# Patient Record
Sex: Male | Born: 1990 | Race: White | Hispanic: No | Smoking: Current some day smoker
Health system: Southern US, Community
[De-identification: ages and names within clinical notes are randomized; demographics above are authoritative.]

---
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
## 2010-08-30 ENCOUNTER — Inpatient Hospital Stay (INDEPENDENT_AMBULATORY_CARE_PROVIDER_SITE_OTHER)
Admission: RE | Admit: 2010-08-30 | Discharge: 2010-08-30 | Disposition: A | Discharge status: Home / Self Care | Payer: Self-pay | Source: Ambulatory Visit | Attending: Family Medicine | Admitting: Family Medicine

## 2010-08-30 DIAGNOSIS — N390 Urinary tract infection, site not specified: Secondary | ICD-10-CM

## 2010-08-30 LAB — POCT URINALYSIS DIP (DEVICE)
Ketones, ur: 15 mg/dL — AB
Protein, ur: NEGATIVE mg/dL
Specific Gravity, Urine: 1.02 (ref 1.005–1.030)
pH: 7 (ref 5.0–8.0)

## 2011-02-07 ENCOUNTER — Emergency Department: Payer: Self-pay | Admitting: *Deleted

## 2011-11-27 ENCOUNTER — Emergency Department: Payer: Self-pay | Admitting: Emergency Medicine

## 2011-11-27 LAB — URINALYSIS, COMPLETE
Bacteria: NONE SEEN
Bilirubin,UR: NEGATIVE
Glucose,UR: NEGATIVE mg/dL (ref 0–75)
Ph: 7 (ref 4.5–8.0)
RBC,UR: 2 /HPF (ref 0–5)
WBC UR: 12 /HPF (ref 0–5)

## 2011-11-29 LAB — URINE CULTURE

## 2013-05-05 ENCOUNTER — Emergency Department: Payer: Self-pay | Admitting: Emergency Medicine

## 2014-03-07 ENCOUNTER — Emergency Department: Payer: Self-pay | Admitting: Emergency Medicine

## 2014-03-07 LAB — URINALYSIS, COMPLETE
BILIRUBIN, UR: NEGATIVE
Bacteria: NONE SEEN
Blood: NEGATIVE
GLUCOSE, UR: NEGATIVE mg/dL (ref 0–75)
Ketone: NEGATIVE
LEUKOCYTE ESTERASE: NEGATIVE
Nitrite: NEGATIVE
PROTEIN: NEGATIVE
Ph: 7 (ref 4.5–8.0)
Specific Gravity: 1.017 (ref 1.003–1.030)
WBC UR: <1 /HPF (ref 0–5)

## 2014-03-07 LAB — GC/CHLAMYDIA PROBE AMP

## 2017-06-30 ENCOUNTER — Emergency Department: Payer: Self-pay

## 2017-06-30 ENCOUNTER — Other Ambulatory Visit: Payer: Self-pay

## 2017-06-30 ENCOUNTER — Encounter: Payer: Self-pay | Admitting: Emergency Medicine

## 2017-06-30 ENCOUNTER — Emergency Department
Admission: EM | Admit: 2017-06-30 | Discharge: 2017-06-30 | Disposition: A | Discharge status: Home / Self Care | Payer: Self-pay | Attending: Emergency Medicine | Admitting: Emergency Medicine

## 2017-06-30 DIAGNOSIS — Z23 Encounter for immunization: Secondary | ICD-10-CM | POA: Insufficient documentation

## 2017-06-30 DIAGNOSIS — R059 Cough, unspecified: Secondary | ICD-10-CM

## 2017-06-30 DIAGNOSIS — H5712 Ocular pain, left eye: Secondary | ICD-10-CM

## 2017-06-30 DIAGNOSIS — Y929 Unspecified place or not applicable: Secondary | ICD-10-CM | POA: Insufficient documentation

## 2017-06-30 DIAGNOSIS — F17200 Nicotine dependence, unspecified, uncomplicated: Secondary | ICD-10-CM | POA: Insufficient documentation

## 2017-06-30 DIAGNOSIS — Y999 Unspecified external cause status: Secondary | ICD-10-CM | POA: Insufficient documentation

## 2017-06-30 DIAGNOSIS — S61210A Laceration without foreign body of right index finger without damage to nail, initial encounter: Secondary | ICD-10-CM | POA: Insufficient documentation

## 2017-06-30 DIAGNOSIS — S60221A Contusion of right hand, initial encounter: Secondary | ICD-10-CM | POA: Insufficient documentation

## 2017-06-30 DIAGNOSIS — R05 Cough: Secondary | ICD-10-CM | POA: Insufficient documentation

## 2017-06-30 DIAGNOSIS — Y9389 Activity, other specified: Secondary | ICD-10-CM | POA: Insufficient documentation

## 2017-06-30 DIAGNOSIS — T07XXXA Unspecified multiple injuries, initial encounter: Secondary | ICD-10-CM

## 2017-06-30 DIAGNOSIS — S0083XA Contusion of other part of head, initial encounter: Secondary | ICD-10-CM | POA: Insufficient documentation

## 2017-06-30 DIAGNOSIS — W503XXA Accidental bite by another person, initial encounter: Secondary | ICD-10-CM

## 2017-06-30 MED ORDER — ACETAMINOPHEN 500 MG PO TABS
1000.0000 mg | ORAL_TABLET | Freq: Once | ORAL | Status: AC
  Administered 2017-06-30: 1000 mg via ORAL
  Filled 2017-06-30: qty 2

## 2017-06-30 MED ORDER — FLUORESCEIN SODIUM 1 MG OP STRP
ORAL_STRIP | OPHTHALMIC | Status: AC
  Administered 2017-06-30: 1
  Filled 2017-06-30: qty 1

## 2017-06-30 MED ORDER — AMOXICILLIN-POT CLAVULANATE 875-125 MG PO TABS: 1.0000 | ORAL_TABLET | Freq: Two times a day (BID) | ORAL | 0 refills | Status: AC

## 2017-06-30 MED ORDER — TETRACAINE HCL 0.5 % OP SOLN
1.0000 [drp] | Freq: Once | OPHTHALMIC | Status: AC
Start: 2017-06-30 — End: 2017-06-30
  Administered 2017-06-30: 1 [drp] via OPHTHALMIC
  Filled 2017-06-30: qty 4

## 2017-06-30 MED ORDER — AMOXICILLIN-POT CLAVULANATE 875-125 MG PO TABS
1.0000 | ORAL_TABLET | Freq: Once | ORAL | Status: AC
  Administered 2017-06-30: 1 via ORAL
  Filled 2017-06-30: qty 1

## 2017-06-30 MED ORDER — TETANUS-DIPHTH-ACELL PERTUSSIS 5-2.5-18.5 LF-MCG/0.5 IM SUSP
0.5000 mL | Freq: Once | INTRAMUSCULAR | Status: AC
  Administered 2017-06-30: 0.5 mL via INTRAMUSCULAR
  Filled 2017-06-30: qty 0.5
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
## 2017-08-05 ENCOUNTER — Encounter: Payer: Self-pay | Admitting: Emergency Medicine

## 2017-08-05 ENCOUNTER — Emergency Department
Admission: EM | Admit: 2017-08-05 | Discharge: 2017-08-05 | Disposition: A | Discharge status: Home / Self Care | Attending: Emergency Medicine | Admitting: Emergency Medicine

## 2017-08-05 ENCOUNTER — Other Ambulatory Visit: Payer: Self-pay

## 2017-08-05 DIAGNOSIS — F17210 Nicotine dependence, cigarettes, uncomplicated: Secondary | ICD-10-CM | POA: Insufficient documentation

## 2017-08-05 DIAGNOSIS — L84 Corns and callosities: Secondary | ICD-10-CM | POA: Insufficient documentation

## 2017-08-05 DIAGNOSIS — M79672 Pain in left foot: Secondary | ICD-10-CM

## 2017-08-05 MED ORDER — DICLOFENAC SODIUM 1 % TD GEL: 2.0000 g | Freq: Four times a day (QID) | TRANSDERMAL | 0 refills | Status: AC
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
## 2017-08-05 NOTE — ED Notes (Signed)
esig does not work and unable to print out.  Patient given instruictions.

## 2017-08-05 NOTE — ED Triage Notes (Signed)
Patient presents to ED via POV from home with c/o left heel pain. Slight redness noted. Ambulatory to triage.

## 2017-08-24 ENCOUNTER — Encounter (HOSPITAL_COMMUNITY): Payer: Self-pay | Admitting: Emergency Medicine

## 2017-08-24 ENCOUNTER — Emergency Department (HOSPITAL_COMMUNITY)
Admission: EM | Admit: 2017-08-24 | Discharge: 2017-08-24 | Disposition: A | Discharge status: Home / Self Care | Payer: Self-pay | Attending: Emergency Medicine | Admitting: Emergency Medicine

## 2017-08-24 DIAGNOSIS — F17210 Nicotine dependence, cigarettes, uncomplicated: Secondary | ICD-10-CM | POA: Insufficient documentation

## 2017-08-24 DIAGNOSIS — Z0000 Encounter for general adult medical examination without abnormal findings: Secondary | ICD-10-CM | POA: Insufficient documentation

## 2017-08-24 DIAGNOSIS — Z139 Encounter for screening, unspecified: Secondary | ICD-10-CM
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
## 2017-09-05 ENCOUNTER — Encounter: Payer: Self-pay | Admitting: Emergency Medicine

## 2017-09-05 ENCOUNTER — Other Ambulatory Visit: Payer: Self-pay

## 2017-09-05 ENCOUNTER — Emergency Department
Admission: EM | Admit: 2017-09-05 | Discharge: 2017-09-05 | Disposition: A | Discharge status: Home / Self Care | Payer: Self-pay | Attending: Emergency Medicine | Admitting: Emergency Medicine

## 2017-09-05 DIAGNOSIS — R300 Dysuria: Secondary | ICD-10-CM | POA: Insufficient documentation

## 2017-09-05 DIAGNOSIS — N4889 Other specified disorders of penis: Secondary | ICD-10-CM | POA: Insufficient documentation

## 2017-09-05 DIAGNOSIS — F17210 Nicotine dependence, cigarettes, uncomplicated: Secondary | ICD-10-CM | POA: Insufficient documentation

## 2017-09-05 DIAGNOSIS — R369 Urethral discharge, unspecified: Secondary | ICD-10-CM

## 2017-09-05 DIAGNOSIS — Z113 Encounter for screening for infections with a predominantly sexual mode of transmission: Secondary | ICD-10-CM

## 2017-09-05 LAB — URINALYSIS, COMPLETE (UACMP) WITH MICROSCOPIC
BACTERIA UA: NONE SEEN
BILIRUBIN URINE: NEGATIVE
Glucose, UA: NEGATIVE mg/dL
KETONES UR: NEGATIVE mg/dL
Nitrite: NEGATIVE
PH: 6 (ref 5.0–8.0)
Protein, ur: 30 mg/dL — AB
Specific Gravity, Urine: 1.024 (ref 1.005–1.030)
Squamous Epithelial / LPF: NONE SEEN (ref 0–5)

## 2017-09-05 LAB — CHLAMYDIA/NGC RT PCR (ARMC ONLY)
Chlamydia Tr: DETECTED — AB
N gonorrhoeae: DETECTED — AB

## 2017-09-05 MED ORDER — CEFTRIAXONE SODIUM 250 MG IJ SOLR
250.0000 mg | Freq: Once | INTRAMUSCULAR | Status: AC
  Administered 2017-09-05: 250 mg via INTRAMUSCULAR
  Filled 2017-09-05: qty 250

## 2017-09-05 MED ORDER — AZITHROMYCIN 500 MG PO TABS
1000.0000 mg | ORAL_TABLET | Freq: Once | ORAL | Status: AC
  Administered 2017-09-05: 1000 mg via ORAL
  Filled 2017-09-05: qty 2

## 2017-09-05 NOTE — ED Provider Notes (Signed)
[facility] Emergency Department Provider Note  ____________________________________________  Time seen: Approximately 5:26 PM  I have reviewed the triage vital signs and the nursing notes.   HISTORY  Chief Complaint Exposure to STD    HPI Ernest Li is a 27 y.o. male who presents emergency department complaining of dysuria, penile irritation, penile drainage.  Patient reports that he had unprotected sex 3 days ago with a new partner.  Patient is unsure of partners STD status.  Patient reports that since then he has developed dysuria, constant urethral irritation, penile drainage.  Patient denies any hematuria.  He reports that there is some tenderness in the suprapubic region with urination, however no tenderness at baseline.  No other complaints.  Patient denies any flank pain.  No history of kidney stones.  Patient reports that he is bisexual and he does have concerns for other STDs.  At this time, lengthy discussion was had with patient and he will follow-up with health department for complete STD panel.  Patient is currently asymptomatic for other STDs to include jaundice, right upper quadrant pain, unexplained rash, weight loss, lesions or chancres.  No other complaints at this time  History reviewed. No pertinent past medical history.  There are no active problems to display for this patient.   History reviewed. No pertinent surgical history.  Prior to Admission medications   Medication Sig Start Date End Date Taking? Authorizing Provider  diclofenac sodium (VOLTAREN) 1 % GEL Apply 2 g topically 4 (four) times daily. 08/05/17   Enid Derry, PA-C    Allergies Patient has no known allergies.  History reviewed. No pertinent family history.  Social History Social History   Tobacco Use  . Smoking status: Current Some Day Smoker    Packs/day: 0.50    Types: Cigarettes  . Smokeless tobacco: Never Used  Substance Use Topics  . Alcohol use: Yes     Comment: Social   . Drug use: Yes    Types: Marijuana     Review of Systems  Constitutional: No fever/chills Eyes: No visual changes. No discharge ENT: No upper respiratory complaints. Cardiovascular: no chest pain. Respiratory: no cough. No SOB. Gastrointestinal: No abdominal pain.  No nausea, no vomiting.  No diarrhea.  No constipation. Genitourinary: Positive for dysuria, penile discharge, urethral irritation. No hematuria Musculoskeletal: Negative for musculoskeletal pain. Skin: Negative for rash, abrasions, lacerations, ecchymosis. Neurological: Negative for headaches, focal weakness or numbness. 10-point ROS otherwise negative.  ____________________________________________   PHYSICAL EXAM:  VITAL SIGNS: ED Triage Vitals [09/05/17 1634]  Enc Vitals Group     BP (!) 157/94     Pulse Rate (!) 107     Resp 18     Temp 97.9 F (36.6 C)     Temp Source Oral     SpO2 95 %     Weight 150 lb (68 kg)     Height  (1.676 m)     Head Circumference      Peak Flow      Pain Score 0     Pain Loc      Pain Edu?      Excl. in GC?      Constitutional: Alert and oriented. Well appearing and in no acute distress. Eyes: Conjunctivae are normal. PERRL. EOMI. Head: Atraumatic. Neck: No stridor.   Hematological/Lymphatic/Immunilogical: No inguinal lymphadenopathy Cardiovascular: Normal rate, regular rhythm. Normal S1 and S2.  Good peripheral circulation. Respiratory: Normal respiratory effort without tachypnea or retractions. Lungs CTAB. Good  air entry to the bases with no decreased or absent breath sounds. Gastrointestinal: Bowel sounds 4 quadrants. Soft and nontender to palpation. No guarding or rigidity. No palpable masses. No distention. No CVA tenderness. Genitourinary: No visible lesions or chancres.  No discharge.  No tenderness to palpation of genitalia or testicles. Musculoskeletal: Full range of motion to all extremities. No gross deformities  appreciated. Neurologic:  Normal speech and language. No gross focal neurologic deficits are appreciated.  Skin:  Skin is warm, dry and intact. No rash noted. Psychiatric: Mood and affect are normal. Speech and behavior are normal. Patient exhibits appropriate insight and judgement.   ____________________________________________   LABS (all labs ordered are listed, but only abnormal results are displayed)  Labs Reviewed  CHLAMYDIA/NGC RT PCR ([facility] ONLY)  URINALYSIS, COMPLETE (UACMP) WITH MICROSCOPIC   ____________________________________________  EKG   ____________________________________________  RADIOLOGY   No results found.  ____________________________________________    PROCEDURES  Procedure(s) performed:    Procedures    Medications  cefTRIAXone (ROCEPHIN) injection 250 mg (has no administration in time range)  azithromycin (ZITHROMAX) tablet 1,000 mg (has no administration in time range)     ____________________________________________   INITIAL IMPRESSION / ASSESSMENT AND PLAN / ED COURSE  Pertinent labs & imaging results that were available during my care of the patient were reviewed by me and considered in my medical decision making (see chart for details).  Review of the [place] CSRS was performed in accordance of the [facility] prior to dispensing any controlled drugs.     Patient's diagnosis is consistent with dysuria, penile discharge, encounter for STD testing.  Patient presents the emergency department with dysuria, penile discharge x2 days.  Patient had a sexual encounter with a new partner that was unprotected 3 days prior.  Symptoms are most consistent with gonorrhea and/or chlamydia.  Patient is tested for same.  Patient was given option to wait for testing to return to be treated empirically.  Patient opts for empiric treatment.  Patient was treated with Rocephin and Zithromax.  He is advised to follow-up with health department for  complete STD panel.  Patient verbalizes understanding of same.  No indication for further work-up at this time.  No prescriptions at discharge.. Patient is given ED precautions to return to the ED for any worsening or new symptoms.     ____________________________________________  FINAL CLINICAL IMPRESSION(S) / ED DIAGNOSES  Final diagnoses:  Screen for STD (sexually transmitted disease)  Penile discharge  Dysuria      NEW MEDICATIONS STARTED DURING THIS VISIT:  ED Discharge Orders    None          This chart was dictated using voice recognition software/Dragon. Despite best efforts to proofread, errors can occur which can change the meaning. Any change was purely unintentional.    Racheal Patches, PA-C 09/05/17 1732    Jeanmarie Plant, MD 09/05/17 2252

## 2017-09-05 NOTE — ED Triage Notes (Signed)
Pt reports 2-3 days ago had unprotected sex with male, states he has noticed difficulty urinating and penile discharge, pt states he is having discomfort in the subprapubic area.

## 2017-09-07 ENCOUNTER — Telehealth: Payer: Self-pay | Admitting: Emergency Medicine

## 2017-09-07 NOTE — Telephone Encounter (Signed)
Called patient to give std results.  Mom will have him call me when he is in.

## 2018-06-15 ENCOUNTER — Encounter (HOSPITAL_COMMUNITY): Payer: Self-pay | Admitting: Emergency Medicine

## 2018-06-15 ENCOUNTER — Emergency Department (HOSPITAL_COMMUNITY)
Admission: EM | Admit: 2018-06-15 | Discharge: 2018-06-15 | Disposition: A | Discharge status: Home / Self Care | Payer: Self-pay | Attending: Emergency Medicine | Admitting: Emergency Medicine

## 2018-06-15 DIAGNOSIS — F17210 Nicotine dependence, cigarettes, uncomplicated: Secondary | ICD-10-CM | POA: Insufficient documentation

## 2018-06-15 DIAGNOSIS — R369 Urethral discharge, unspecified: Secondary | ICD-10-CM | POA: Insufficient documentation

## 2018-06-15 DIAGNOSIS — R300 Dysuria: Secondary | ICD-10-CM | POA: Insufficient documentation

## 2018-06-15 MED ORDER — STERILE WATER FOR INJECTION IJ SOLN
INTRAMUSCULAR | Status: AC
  Filled 2018-06-15: qty 10

## 2018-06-15 MED ORDER — ONDANSETRON 4 MG PO TBDP
4.0000 mg | ORAL_TABLET | Freq: Once | ORAL | Status: AC
  Administered 2018-06-15: 4 mg via ORAL
  Filled 2018-06-15: qty 1

## 2018-06-15 MED ORDER — AZITHROMYCIN 250 MG PO TABS
1000.0000 mg | ORAL_TABLET | Freq: Once | ORAL | Status: AC
  Administered 2018-06-15: 1000 mg via ORAL
  Filled 2018-06-15: qty 4

## 2018-06-15 MED ORDER — CEFTRIAXONE SODIUM 250 MG IJ SOLR
250.0000 mg | Freq: Once | INTRAMUSCULAR | Status: AC
  Administered 2018-06-15: 250 mg via INTRAMUSCULAR
  Filled 2018-06-15: qty 250

## 2018-06-15 NOTE — ED Triage Notes (Signed)
Patient here from home with complaints of of painful urination and discharge "for a couple of days now". Denies n/v. Hx of same.

## 2018-06-15 NOTE — Discharge Instructions (Addendum)
We have tested you today for gonorrhea and chlamydia. We will call you if the results are positive. We have treated your today due to your history and exam for gonorrhea and chlamydia.

## 2018-06-15 NOTE — ED Provider Notes (Signed)
[facility]-EMERGENCY DEPT Provider Note   CSN: [id number] Arrival date & time: 06/15/18  1414     History   Chief Complaint Chief Complaint  Patient presents with  . Penile Discharge  . Dysuria    HPI Ernest Li is a 28 y.o. male who presents to the ED with c/o dysuria and penile discharge. Patient reports having unprotected sex. He states he has had chlamydia in the past but not with this partner.   HPI  History reviewed. No pertinent past medical history.  There are no active problems to display for this patient.   History reviewed. No pertinent surgical history.      Home Medications    Prior to Admission medications   Medication Sig Start Date End Date Taking? Authorizing Provider  diclofenac sodium (VOLTAREN) 1 % GEL Apply 2 g topically 4 (four) times daily. 08/05/17   Enid Derry, PA-C    Family History No family history on file.  Social History Social History   Tobacco Use  . Smoking status: Current Some Day Smoker    Packs/day: 0.50    Types: Cigarettes  . Smokeless tobacco: Never Used  Substance Use Topics  . Alcohol use: Yes    Comment: Social   . Drug use: Yes    Types: Marijuana     Allergies   Patient has no known allergies.   Review of Systems Review of Systems  Genitourinary: Positive for discharge and dysuria.  Hematological: Positive for adenopathy.  All other systems reviewed and are negative.    Physical Exam Updated Vital Signs BP (!) 144/93   Pulse 96   Temp 98.5 F (36.9 C)   Resp 15   SpO2 99%   Physical Exam Vitals signs and nursing note reviewed. Exam conducted with a chaperone present.  Constitutional:      General: He is not in acute distress.    Appearance: He is well-developed.  HENT:     Head: Normocephalic.     Nose: Nose normal.  Eyes:     Conjunctiva/sclera: Conjunctivae normal.  Neck:     Musculoskeletal: Neck supple.  Cardiovascular:     Rate and Rhythm: Normal  rate.  Pulmonary:     Effort: Pulmonary effort is normal.  Abdominal:     Tenderness: There is no abdominal tenderness.  Genitourinary:    Penis: Circumcised. Discharge present.      Scrotum/Testes: Normal.     Comments: Purulent ureteral discharge.  Musculoskeletal: Normal range of motion.  Lymphadenopathy:     Lower Body: Right inguinal adenopathy present.  Skin:    General: Skin is warm and dry.  Neurological:     Mental Status: He is alert and oriented to person, place, and time.  Psychiatric:        Mood and Affect: Mood normal.      ED Treatments / Results  Labs (all labs ordered are listed, but only abnormal results are displayed) Labs Reviewed  GC/CHLAMYDIA PROBE AMP ([place]) NOT AT [facility]   Radiology No results found.  Procedures Procedures (including critical care time)  Medications Ordered in ED Medications  cefTRIAXone (ROCEPHIN) injection 250 mg (has no administration in time range)  azithromycin (ZITHROMAX) tablet 1,000 mg (has no administration in time range)  ondansetron (ZOFRAN-ODT) disintegrating tablet 4 mg (has no administration in time range)     Initial Impression / Assessment and Plan / ED Course  I have reviewed the triage vital signs and the nursing notes. Pt  presents with concerns for possible STD.  Pt understands that they have GC/Chlamydia cultures pending and that they will need to inform all sexual partners if results return positive. Pt has been treated prophylactically with azithromycin and Rocephin due to pts history and physical exam. Patient d/c home with instructions to f/u with [facility] for further testing. Patient declined testing for RPR and HIV today.   Final Clinical Impressions(s) / ED Diagnoses   Final diagnoses:  Dysuria  Penile discharge    ED Discharge Orders    None       Kerrie Buffalo [place], [place] 06/15/18 1457    Mancel Bale, MD 06/15/18 [phone]

## 2018-06-15 NOTE — ED Notes (Signed)
RN attempted to discharge patient. Patient crying after rocephin shot and RN encouraged him to take a minute to breathe. Pt will be discharged when no longer crying

## 2018-06-16 LAB — GC/CHLAMYDIA PROBE AMP (~~LOC~~) NOT AT ARMC
CHLAMYDIA, DNA PROBE: NEGATIVE
NEISSERIA GONORRHEA: POSITIVE — AB

## 2020-01-27 ENCOUNTER — Emergency Department (HOSPITAL_COMMUNITY)
Admission: EM | Admit: 2020-01-27 | Discharge: 2020-01-28 | Disposition: A | Discharge status: Home / Self Care | Payer: Self-pay | Attending: Emergency Medicine | Admitting: Emergency Medicine

## 2020-01-27 ENCOUNTER — Encounter (HOSPITAL_COMMUNITY): Payer: Self-pay

## 2020-01-27 DIAGNOSIS — F17210 Nicotine dependence, cigarettes, uncomplicated: Secondary | ICD-10-CM | POA: Insufficient documentation

## 2020-01-27 DIAGNOSIS — R109 Unspecified abdominal pain: Secondary | ICD-10-CM | POA: Insufficient documentation

## 2020-01-27 LAB — URINALYSIS, ROUTINE W REFLEX MICROSCOPIC
Bilirubin Urine: NEGATIVE
Glucose, UA: NEGATIVE mg/dL
Hgb urine dipstick: NEGATIVE
Ketones, ur: NEGATIVE mg/dL
Leukocytes,Ua: NEGATIVE
Nitrite: NEGATIVE
Protein, ur: NEGATIVE mg/dL
Specific Gravity, Urine: 1.018 (ref 1.005–1.030)
pH: 6 (ref 5.0–8.0)

## 2020-01-27 NOTE — ED Triage Notes (Signed)
Onset yesterday left side back pain and urinary hesitancy.  Denies dysuria, hematuria, penile discharge, or fever.

## 2020-01-28 ENCOUNTER — Emergency Department (HOSPITAL_COMMUNITY): Payer: Self-pay

## 2020-01-28 LAB — COMPREHENSIVE METABOLIC PANEL
ALT: 34 U/L (ref 0–44)
AST: 22 U/L (ref 15–41)
Albumin: 3.7 g/dL (ref 3.5–5.0)
Alkaline Phosphatase: 68 U/L (ref 38–126)
Anion gap: 8 (ref 5–15)
BUN: 10 mg/dL (ref 6–20)
CO2: 27 mmol/L (ref 22–32)
Calcium: 9 mg/dL (ref 8.9–10.3)
Chloride: 102 mmol/L (ref 98–111)
Creatinine, Ser: 1.02 mg/dL (ref 0.61–1.24)
GFR calc Af Amer: >60 mL/min (ref >60)
GFR calc non Af Amer: >60 mL/min (ref >60)
Glucose, Bld: 92 mg/dL (ref 70–99)
Potassium: 4.1 mmol/L (ref 3.5–5.1)
Sodium: 137 mmol/L (ref 135–145)
Total Bilirubin: 0.9 mg/dL (ref 0.3–1.2)
Total Protein: 7 g/dL (ref 6.5–8.1)

## 2020-01-28 LAB — CBC
HCT: 40.2 % (ref 39.0–52.0)
Hemoglobin: 12.8 g/dL — ABNORMAL LOW (ref 13.0–17.0)
MCH: 31.9 pg (ref 26.0–34.0)
MCHC: 31.8 g/dL (ref 30.0–36.0)
MCV: 100.2 fL — ABNORMAL HIGH (ref 80.0–100.0)
Platelets: 107 10*3/uL — ABNORMAL LOW (ref 150–400)
RBC: 4.01 MIL/uL — ABNORMAL LOW (ref 4.22–5.81)
RDW: 12.2 % (ref 11.5–15.5)
WBC: 5.6 10*3/uL (ref 4.0–10.5)
nRBC: 0 % (ref 0.0–0.2)

## 2020-01-28 MED ORDER — KETOROLAC TROMETHAMINE 15 MG/ML IJ SOLN
30.0000 mg | Freq: Once | INTRAMUSCULAR | Status: AC
  Administered 2020-01-28: 30 mg via INTRAMUSCULAR
  Filled 2020-01-28: qty 2

## 2020-01-28 NOTE — Discharge Instructions (Addendum)
You were evaluated in the Emergency Department and after careful evaluation, we did not find any emergent condition requiring admission or further testing in the hospital.  Your exam/testing today was overall reassuring.  Your symptoms may be due to muscle strain or spasm.  You can use Tylenol 1000 mg every 4-6 hours and/or ibuprofen 600 mg every 4-6 hours.  It is unclear what is causing your issues with urination.  Please follow-up with urology for further management.  Please return to the Emergency Department if you experience any worsening of your condition.  Thank you for allowing [place] to be a part of your care.

## 2020-01-28 NOTE — ED Provider Notes (Signed)
[facility]-EMERGENCY DEPT [facility] Emergency Department Provider Note MRN:  [id number]  Arrival date & time: 01/28/20     Chief Complaint   Flank Pain   History of Present Illness   Ernest Li is a 29 y.o. year-old male with a history of kidney stones presenting to the ED with chief complaint of flank pain.  Location: Left flank Duration: 2 days Onset: Sudden Timing: Constant Description: Sharp Severity: Mild to moderate Exacerbating/Alleviating Factors: None Associated Symptoms: Difficulty urinating Pertinent Negatives: Denies fever, no chest pain, no shortness of breath, no burning with urination, no blood in the urine   Review of Systems  A complete 10 system review of systems was obtained and all systems are negative except as noted in the HPI and PMH.   Patient's Health History   History reviewed. No pertinent past medical history.  History reviewed. No pertinent surgical history.  History reviewed. No pertinent family history.  Social History   Socioeconomic History  . Marital status: Legally Separated    Spouse name: Not on file  . Number of children: Not on file  . Years of education: Not on file  . Highest education level: Not on file  Occupational History  . Not on file  Tobacco Use  . Smoking status: Current Some Day Smoker    Packs/day: 0.50    Types: Cigarettes  . Smokeless tobacco: Never Used  Substance and Sexual Activity  . Alcohol use: Yes    Comment: Social   . Drug use: Yes    Types: Marijuana  . Sexual activity: Yes  Other Topics Concern  . Not on file  Social History Narrative  . Not on file   Social Determinants of Health   Financial Resource Strain:   . Difficulty of Paying Living Expenses: Not on file  Food Insecurity:   . Worried About Programme researcher, broadcasting/film/video in the Last Year: Not on file  . Ran Out of Food in the Last Year: Not on file  Transportation Needs:   . Lack of Transportation (Medical): Not on file  . Lack of  Transportation (Non-Medical): Not on file  Physical Activity:   . Days of Exercise per Week: Not on file  . Minutes of Exercise per Session: Not on file  Stress:   . Feeling of Stress : Not on file  Social Connections:   . Frequency of Communication with Friends and Family: Not on file  . Frequency of Social Gatherings with Friends and Family: Not on file  . Attends Religious Services: Not on file  . Active Member of Clubs or Organizations: Not on file  . Attends Banker Meetings: Not on file  . Marital Status: Not on file  Intimate Partner Violence:   . Fear of Current or Ex-Partner: Not on file  . Emotionally Abused: Not on file  . Physically Abused: Not on file  . Sexually Abused: Not on file     Physical Exam   Vitals:   01/28/20 0032 01/28/20 0244  BP: 136/72 116/73  Pulse: 81 67  Resp: 16 18  Temp:    SpO2: 96% 100%    CONSTITUTIONAL: Well-appearing, NAD NEURO:  Alert and oriented x 3, no focal deficits EYES:  eyes equal and reactive ENT/NECK:  no LAD, no JVD CARDIO: Regular rate, well-perfused, normal S1 and S2 PULM:  CTAB no wheezing or rhonchi GI/GU:  normal bowel sounds, non-distended, non-tender MSK/SPINE:  No gross deformities, no edema SKIN:  no rash, atraumatic PSYCH:  Appropriate speech and behavior  *Additional and/or pertinent findings included in MDM below  Diagnostic and Interventional Summary    EKG Interpretation  Date/Time:    Ventricular Rate:    PR Interval:    QRS Duration:   QT Interval:    QTC Calculation:   R Axis:     Text Interpretation:        Labs Reviewed  CBC - Abnormal; Notable for the following components:      Result Value   RBC 4.01 (*)    Hemoglobin 12.8 (*)    MCV 100.2 (*)    Platelets 107 (*)    All other components within normal limits  URINALYSIS, ROUTINE W REFLEX MICROSCOPIC  COMPREHENSIVE METABOLIC PANEL    CT RENAL STONE STUDY  Final Result      Medications  ketorolac (TORADOL) 15  MG/ML injection 30 mg (30 mg Intramuscular Given 01/28/20 0342)     Procedures  /  Critical Care Procedures  ED Course and Medical Decision Making  I have reviewed the triage vital signs, the nursing notes, and pertinent available records from the EMR.  Listed above are laboratory and imaging tests that I personally ordered, reviewed, and interpreted and then considered in my medical decision making (see below for details).  CT to evaluate for kidney stone, history of stones, having to strain quite a bit to urinate     Work-up negative, normal vital signs, no acute distress, appropriate for discharge.  Elmer Sow. Pilar Plate, MD [facility] Emergency Medicine [facility] [email or web address]  Final Clinical Impressions(s) / ED Diagnoses     ICD-10-CM   1. Flank pain  R10.9     ED Discharge Orders    None       Discharge Instructions Discussed with and Provided to Patient:     Discharge Instructions     You were evaluated in the Emergency Department and after careful evaluation, we did not find any emergent condition requiring admission or further testing in the hospital.  Your exam/testing today was overall reassuring.  Your symptoms may be due to muscle strain or spasm.  You can use Tylenol 1000 mg every 4-6 hours and/or ibuprofen 600 mg every 4-6 hours.  It is unclear what is causing your issues with urination.  Please follow-up with urology for further management.  Please return to the Emergency Department if you experience any worsening of your condition.  Thank you for allowing [place] to be a part of your care.        Sabas Sous, MD 01/28/20 [phone]

## 2020-07-03 ENCOUNTER — Encounter (HOSPITAL_COMMUNITY): Payer: Self-pay | Admitting: Emergency Medicine

## 2020-07-03 ENCOUNTER — Emergency Department (HOSPITAL_COMMUNITY)
Admission: EM | Admit: 2020-07-03 | Discharge: 2020-07-03 | Disposition: A | Discharge status: Home / Self Care | Payer: Self-pay | Attending: Emergency Medicine | Admitting: Emergency Medicine

## 2020-07-03 ENCOUNTER — Emergency Department (HOSPITAL_COMMUNITY): Payer: Self-pay

## 2020-07-03 DIAGNOSIS — R319 Hematuria, unspecified: Secondary | ICD-10-CM | POA: Insufficient documentation

## 2020-07-03 DIAGNOSIS — R3915 Urgency of urination: Secondary | ICD-10-CM | POA: Insufficient documentation

## 2020-07-03 DIAGNOSIS — N3001 Acute cystitis with hematuria: Secondary | ICD-10-CM

## 2020-07-03 DIAGNOSIS — R109 Unspecified abdominal pain: Secondary | ICD-10-CM | POA: Insufficient documentation

## 2020-07-03 DIAGNOSIS — F17210 Nicotine dependence, cigarettes, uncomplicated: Secondary | ICD-10-CM | POA: Insufficient documentation

## 2020-07-03 LAB — COMPREHENSIVE METABOLIC PANEL
ALT: 66 U/L — ABNORMAL HIGH (ref 0–44)
AST: 37 U/L (ref 15–41)
Albumin: 3.8 g/dL (ref 3.5–5.0)
Alkaline Phosphatase: 50 U/L (ref 38–126)
Anion gap: 9 (ref 5–15)
BUN: 10 mg/dL (ref 6–20)
CO2: 26 mmol/L (ref 22–32)
Calcium: 9.2 mg/dL (ref 8.9–10.3)
Chloride: 104 mmol/L (ref 98–111)
Creatinine, Ser: 1.28 mg/dL — ABNORMAL HIGH (ref 0.61–1.24)
GFR, Estimated: >60 mL/min (ref >60)
Glucose, Bld: 108 mg/dL — ABNORMAL HIGH (ref 70–99)
Potassium: 3.8 mmol/L (ref 3.5–5.1)
Sodium: 139 mmol/L (ref 135–145)
Total Bilirubin: 0.7 mg/dL (ref 0.3–1.2)
Total Protein: 6.9 g/dL (ref 6.5–8.1)

## 2020-07-03 LAB — URINALYSIS, ROUTINE W REFLEX MICROSCOPIC
Bilirubin Urine: NEGATIVE
Glucose, UA: NEGATIVE mg/dL
Ketones, ur: NEGATIVE mg/dL
Nitrite: NEGATIVE
Protein, ur: 30 mg/dL — AB
RBC / HPF: >50 RBC/hpf — ABNORMAL HIGH (ref 0–5)
Specific Gravity, Urine: 1.026 (ref 1.005–1.030)
WBC, UA: >50 WBC/hpf — ABNORMAL HIGH (ref 0–5)
pH: 6 (ref 5.0–8.0)

## 2020-07-03 LAB — CBC
HCT: 45.7 % (ref 39.0–52.0)
Hemoglobin: 14.8 g/dL (ref 13.0–17.0)
MCH: 32.4 pg (ref 26.0–34.0)
MCHC: 32.4 g/dL (ref 30.0–36.0)
MCV: 100 fL (ref 80.0–100.0)
Platelets: 186 10*3/uL (ref 150–400)
RBC: 4.57 MIL/uL (ref 4.22–5.81)
RDW: 12.3 % (ref 11.5–15.5)
WBC: 9.3 10*3/uL (ref 4.0–10.5)
nRBC: 0 % (ref 0.0–0.2)

## 2020-07-03 LAB — LIPASE, BLOOD: Lipase: 36 U/L (ref 11–51)

## 2020-07-03 MED ORDER — CEPHALEXIN 500 MG PO CAPS: ORAL_CAPSULE | ORAL | 0 refills | Status: AC

## 2020-07-03 NOTE — ED Notes (Signed)
Patient transported to CT 

## 2020-07-03 NOTE — ED Triage Notes (Signed)
Patient here with complaint of left sided flank pain and hematuria that started last night. Patient also states that sometimes since last night he feels like he needs to urinate but is unable. Patient alert, oriented, and in no apparent distress at this time.

## 2020-07-03 NOTE — ED Provider Notes (Signed)
[facility] EMERGENCY DEPARTMENT Provider Note   CSN: [id number] Arrival date & time: 07/03/20  1119     History Chief Complaint  Patient presents with  . Hematuria    Deundre Bearse is a 30 y.o. male.  Who presents emergency department chief complaint of flank pain and hematuria.  Patient states that he was out with his fiance last night at some friend's house and when he came home he had some pain in his left flank.  When he went to the bathroom he felt urgency to urinate but only had some dribbles of blood come out.  Since that time he has had multiple episodes of urgency to urinate without much production of urine and dribbling of gross hematuria.  He denies any severe pain, nausea, vomiting or history of kidney stones.  He has never had anything like this before.  He denies suprapubic pain.  HPI     History reviewed. No pertinent past medical history.  There are no problems to display for this patient.   History reviewed. No pertinent surgical history.     No family history on file.  Social History   Tobacco Use  . Smoking status: Current Some Day Smoker    Packs/day: 0.50    Types: Cigarettes  . Smokeless tobacco: Never Used  Substance Use Topics  . Alcohol use: Yes    Comment: Social   . Drug use: Yes    Types: Marijuana    Home Medications Prior to Admission medications   Medication Sig Start Date End Date Taking? Authorizing Provider  diclofenac sodium (VOLTAREN) 1 % GEL Apply 2 g topically 4 (four) times daily. 08/05/17   Enid Derry, PA-C    Allergies    Patient has no known allergies.  Review of Systems   Review of Systems Ten systems reviewed and are negative for acute change, except as noted in the HPI.   Physical Exam Updated Vital Signs BP 135/83 (BP Location: Left Arm)   Pulse 96   Temp 97.9 F (36.6 C) (Oral)   Resp 16   SpO2 100%   Physical Exam Vitals and nursing note reviewed.  Constitutional:      General:  He is not in acute distress.    Appearance: He is well-developed and well-nourished. He is not diaphoretic.  HENT:     Head: Normocephalic and atraumatic.  Eyes:     General: No scleral icterus.    Conjunctiva/sclera: Conjunctivae normal.  Cardiovascular:     Rate and Rhythm: Normal rate and regular rhythm.     Heart sounds: Normal heart sounds.  Pulmonary:     Effort: Pulmonary effort is normal. No respiratory distress.     Breath sounds: Normal breath sounds.  Abdominal:     Palpations: Abdomen is soft.     Tenderness: There is no abdominal tenderness. There is no right CVA tenderness or left CVA tenderness.  Musculoskeletal:        General: No edema.     Cervical back: Normal range of motion and neck supple.  Skin:    General: Skin is warm and dry.  Neurological:     Mental Status: He is alert.  Psychiatric:        Behavior: Behavior normal.     ED Results / Procedures / Treatments   Labs (all labs ordered are listed, but only abnormal results are displayed) Labs Reviewed  CBC  LIPASE, BLOOD  COMPREHENSIVE METABOLIC PANEL  URINALYSIS, ROUTINE W REFLEX MICROSCOPIC  EKG None  Radiology No results found.  Procedures Procedures   Medications Ordered in ED Medications - No data to display  ED Course  I have reviewed the triage vital signs and the nursing notes.  Pertinent labs & imaging results that were available during my care of the patient were reviewed by me and considered in my medical decision making (see chart for details).  Clinical Course as of 07/03/20 1404  Wed Jul 03, 2020  1325 Mucus: PRESENT [AH]    Clinical Course User Index [AH] Arthor Captain, PA-C   MDM Rules/Calculators/A&P                         30 year old male here with complaint of left flank pain, hematuria and urgency.  He denies any urethral discharge and is in a monogamous relationship with his fiance.  I ordered and reviewed labs which include CBC which shows no elevated  white blood cell count or other abnormality, CMP with mildly elevated blood blood glucose and slightly elevated creatinine of 1.28 just above his baseline.  Patient's urine shows large amount of blood, white blood cells and bacteria concerning for urinary tract infection.  I ordered and reviewed images of a CT renal stone study which showed no abnormalities.  At this time patient's urine was sent for culture.  He will be treated with oral Keflex and referred to urology for further evaluation.  Patient is advised to return for any worsening symptoms and detailed return precautions given and paperwork. Final Clinical Impression(s) / ED Diagnoses Final diagnoses:  None    Rx / DC Orders ED Discharge Orders    None       Arthor Captain, PA-C 07/03/20 1405    Pollyann Savoy, MD 07/03/20 [phone]

## 2020-07-03 NOTE — Discharge Instructions (Addendum)
Your CT scan was normal.  It does appear that you have a urinary tract infection which is abnormal in men of your age.  I am treating you with an antibiotic which you will need to take and complete the course.  You will also need to follow-up with a urologist at [facility] urology.  Dr. Retta Diones is on call for [facility] today and you may ask for him or the first available urologist.  Contact a health care provider if: Your symptoms do not get better after 1-2 days. Your symptoms go away and then return. Get help right away if: You have severe pain in your back or your lower abdomen. You have a fever or chills. You have nausea or vomiting.

## 2020-07-05 LAB — URINE CULTURE: Culture: >=100000 — AB

## 2022-03-10 IMAGING — CT CT RENAL STONE PROTOCOL
2 of 4 series · 16 of 46 positions shown, 18 images · non-contrast
Comparison: None.

CLINICAL DATA: 29-year-old male with LEFT flank, abdominal and
pelvic pain.

EXAM:
CT ABDOMEN AND PELVIS WITHOUT CONTRAST
TECHNIQUE: Multidetector CT imaging of the abdomen and pelvis was performed
following the standard protocol without IV contrast.

[Series 3: ap without · axial · non-contrast · 0.85mm/px · z∈[+576,+996]mm · 13 of 96 slices shown, 15 images]
[im 6/96  soft-tissue]
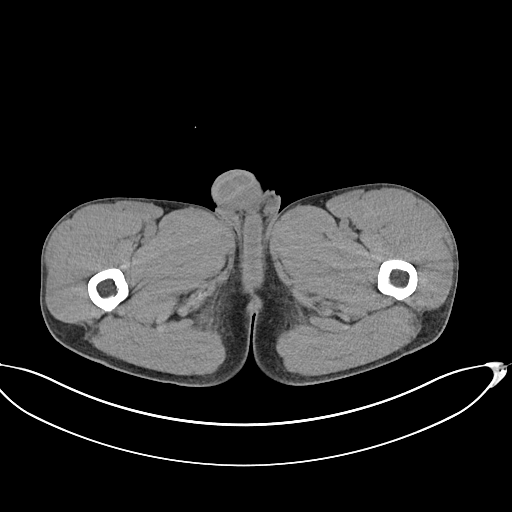
[im 6/96  bone]
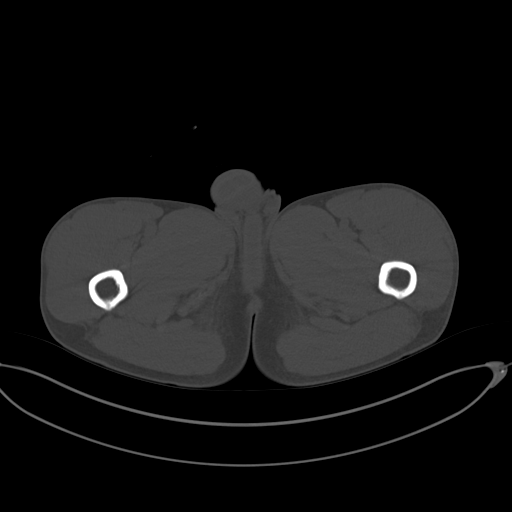
[im 11/96  soft-tissue]
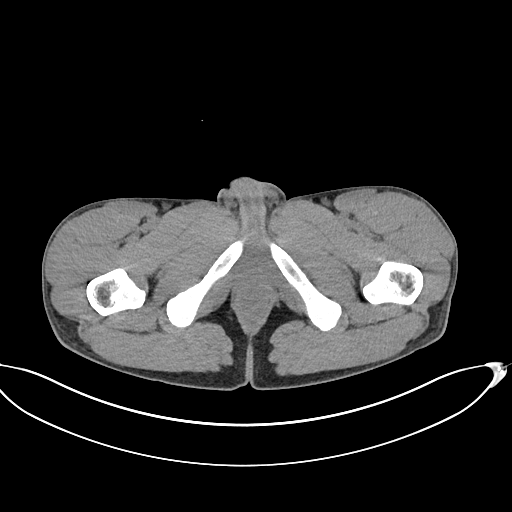
[im 22/96  soft-tissue]
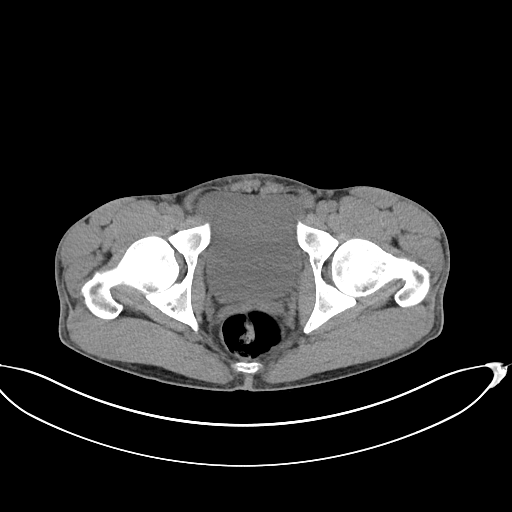
[im 27/96  soft-tissue]
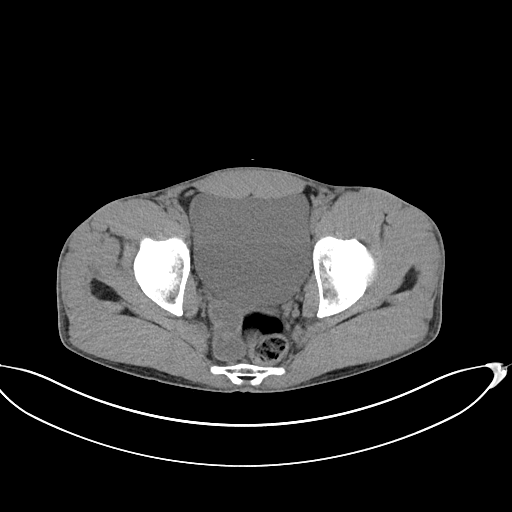
[im 32/96  soft-tissue]
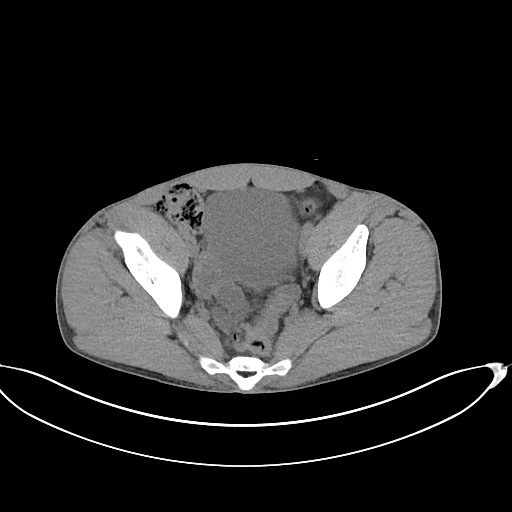
[im 43/96  soft-tissue]
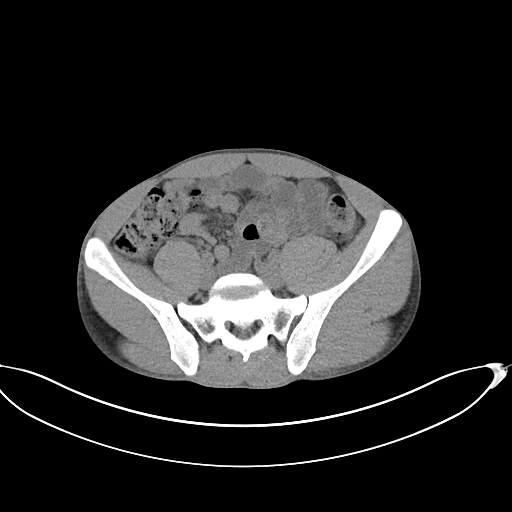
[im 48/96  soft-tissue]
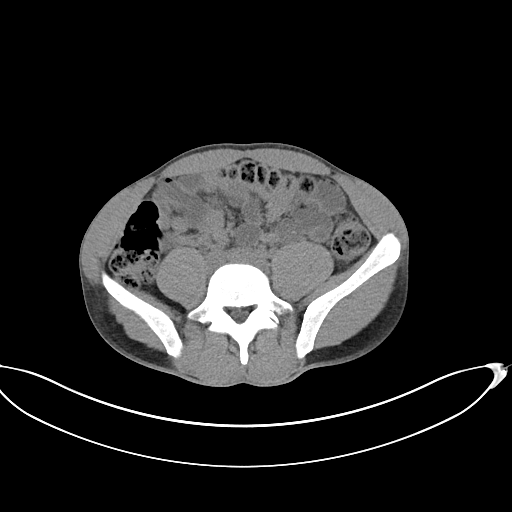
[im 53/96  soft-tissue]
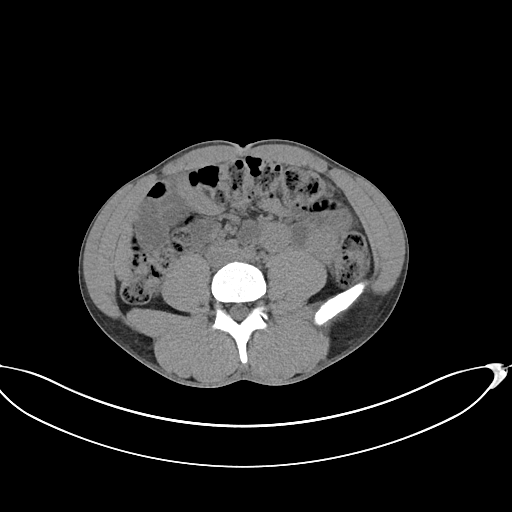
[im 64/96  soft-tissue]
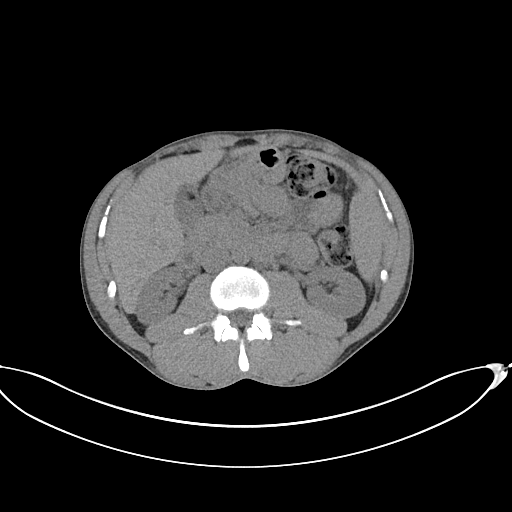
[im 64/96  bone]
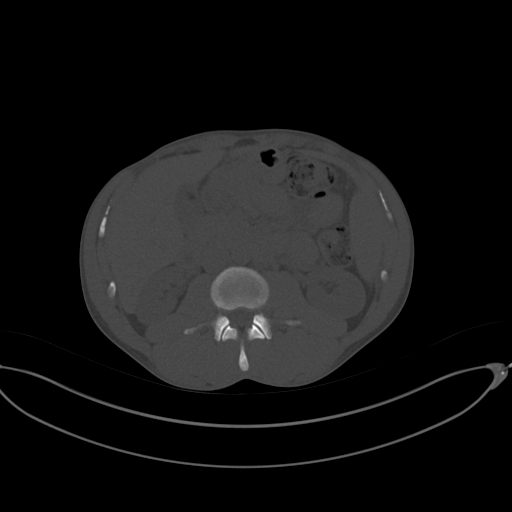
[im 69/96  soft-tissue]
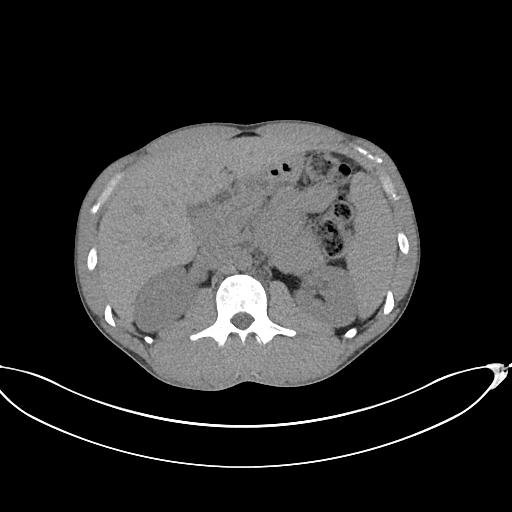
[im 74/96  soft-tissue]
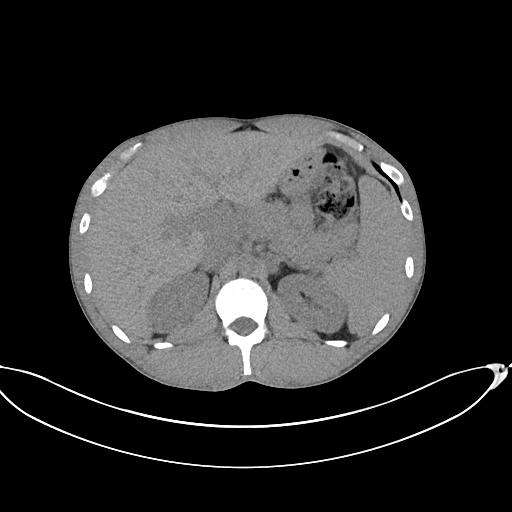
[im 85/96  soft-tissue]
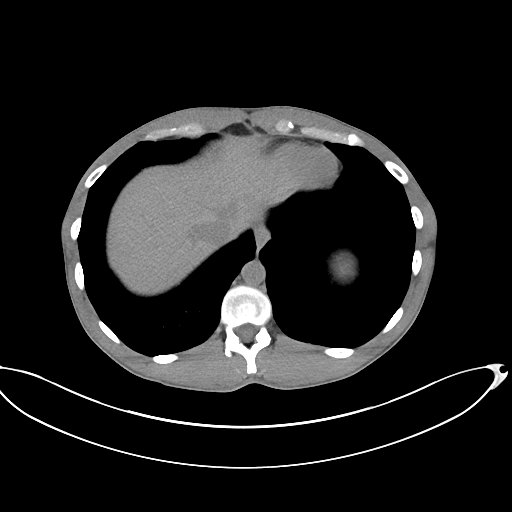
[im 90/96  soft-tissue]
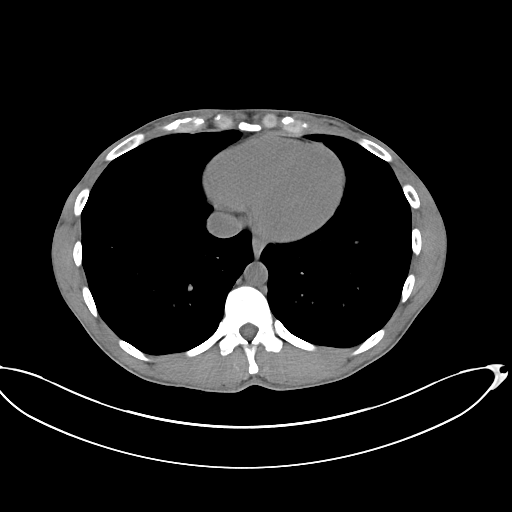

[Series 6: cor · coronal · 0.72mm/px · 3 of 84 slices shown]
[im 28/84  soft-tissue]
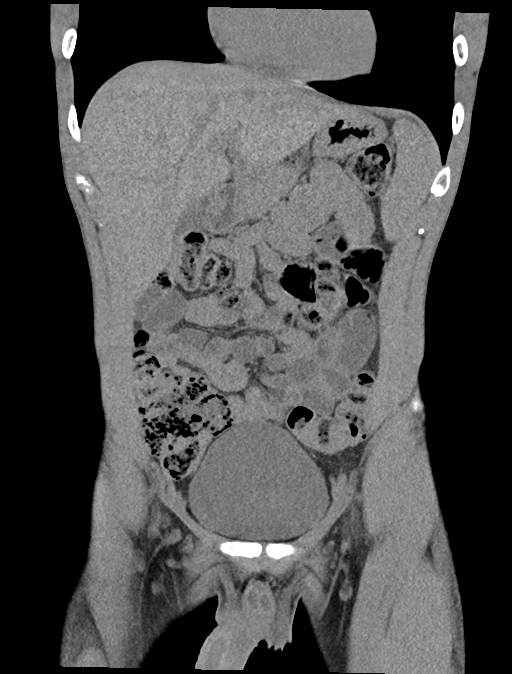
[im 37/84  soft-tissue]
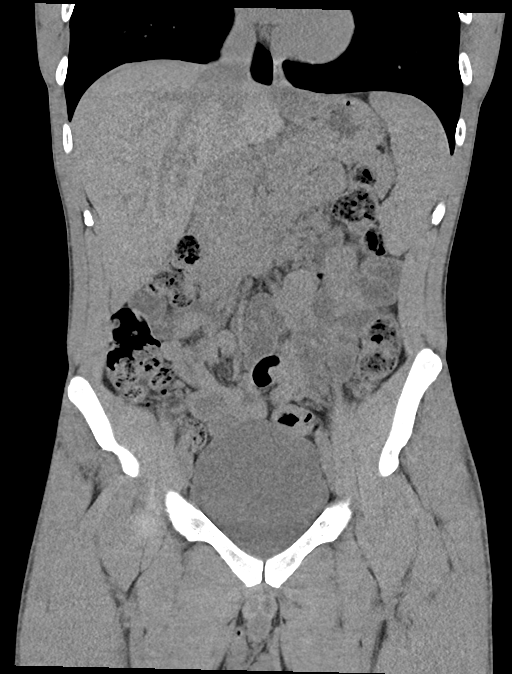
[im 47/84  soft-tissue]
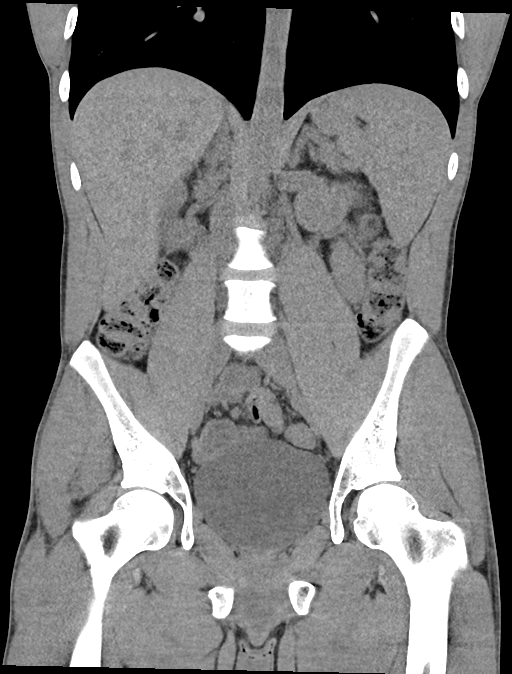

[16 of 46 positions shown; findings below may reference images not displayed]

FINDINGS: Please note that parenchymal abnormalities may be missed without
intravenous contrast.

Lower chest: No acute abnormality

Hepatobiliary: The liver and gallbladder are unremarkable. No
biliary dilatation.

Pancreas: Unremarkable

Spleen: Unremarkable

Adrenals/Urinary Tract: The kidneys, adrenal glands and bladder are
unremarkable. There is no evidence of hydronephrosis or urinary
calculi.

Stomach/Bowel: Stomach is within normal limits. Appendix appears
normal. No evidence of bowel wall thickening, distention, or
inflammatory changes.

Vascular/Lymphatic: No significant vascular findings are present. No
enlarged abdominal or pelvic lymph nodes.

Reproductive: Prostate is unremarkable.

Other: No abdominal wall hernia or abnormality. No abdominopelvic
ascites.

Musculoskeletal: No acute or significant osseous findings.
IMPRESSION: No evidence of acute or significant abnormality. No findings to
suggest a cause for this patient's LEFT flank pain.

## 2022-08-14 IMAGING — CT CT RENAL STONE PROTOCOL
2 of 4 series · 16 of 46 positions shown, 18 images · non-contrast
Comparison: CT abdomen and pelvis 01/28/2020.

CLINICAL DATA: Onset left flank pain and hematuria last night.

EXAM:
CT ABDOMEN AND PELVIS WITHOUT CONTRAST
TECHNIQUE: Multidetector CT imaging of the abdomen and pelvis was performed
following the standard protocol without IV contrast.

[Series 3: ap without · axial · non-contrast · 0.70mm/px · z∈[+677,+1077]mm · 13 of 94 slices shown, 15 images]
[im 7/94  soft-tissue]
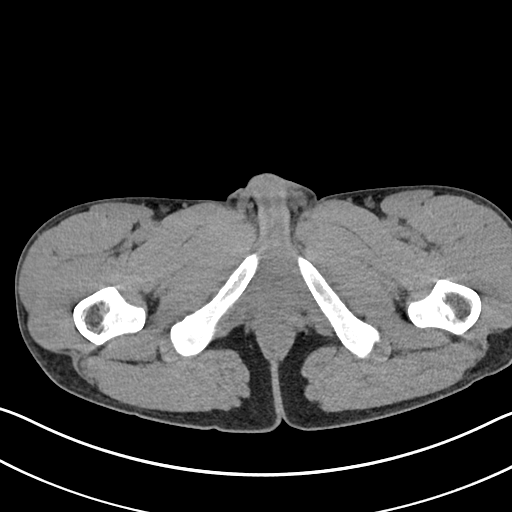
[im 7/94  bone]
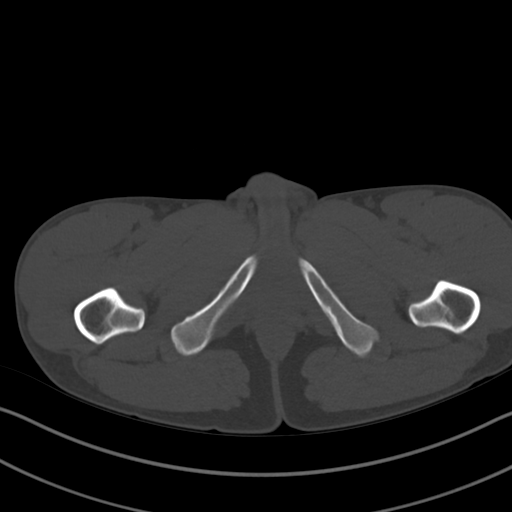
[im 13/94  soft-tissue]
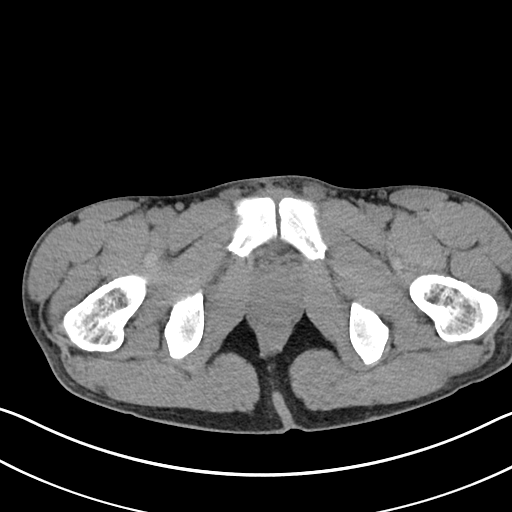
[im 19/94  soft-tissue]
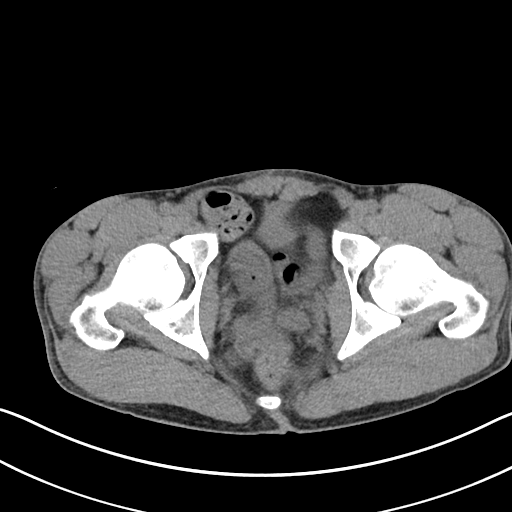
[im 25/94  soft-tissue]
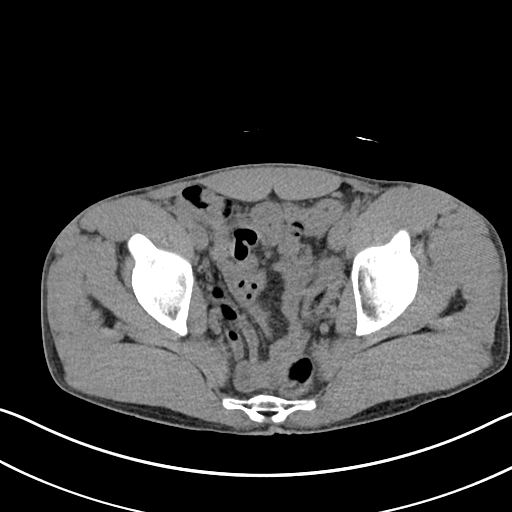
[im 32/94  soft-tissue]
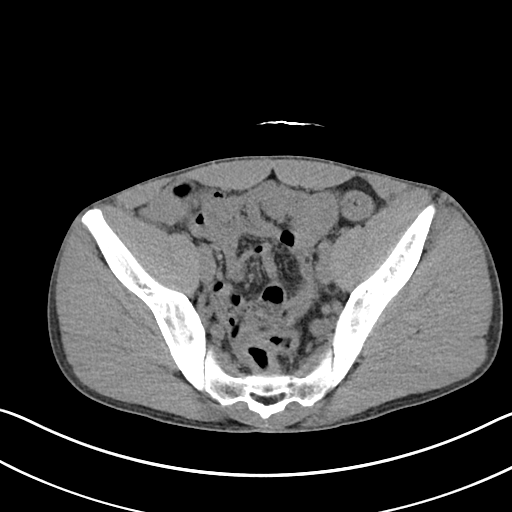
[im 38/94  soft-tissue]
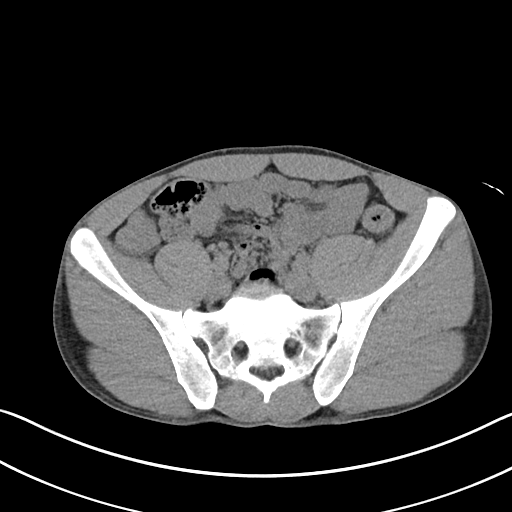
[im 50/94  soft-tissue]
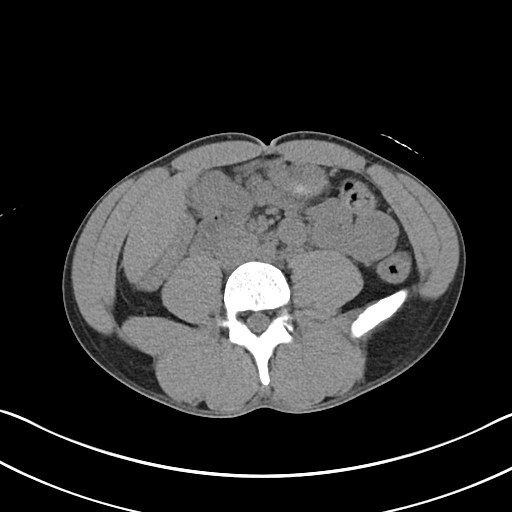
[im 56/94  soft-tissue]
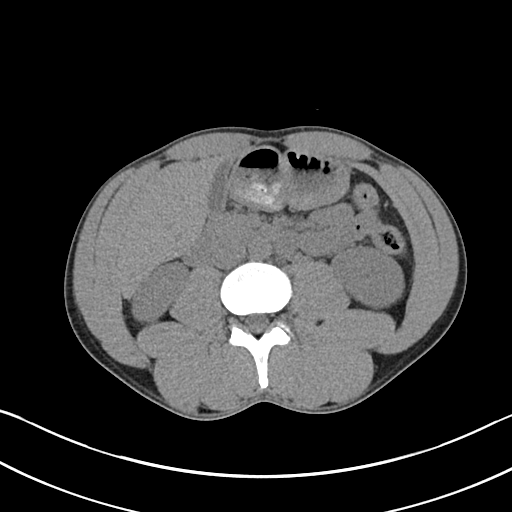
[im 63/94  soft-tissue]
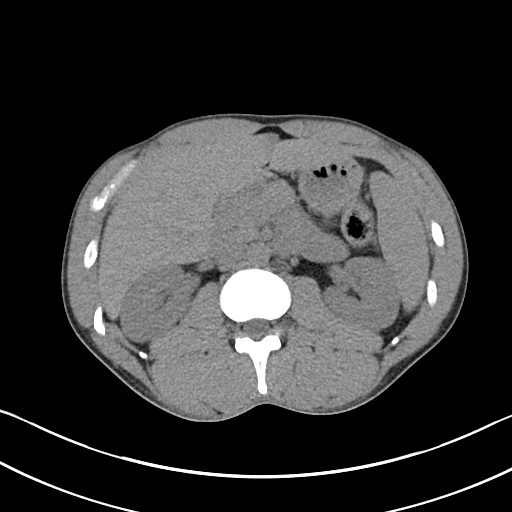
[im 63/94  bone]
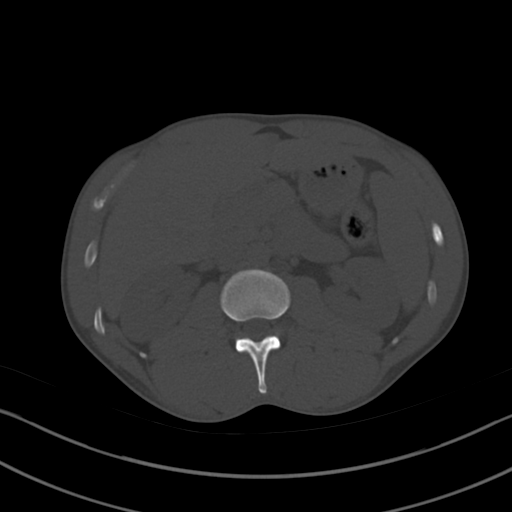
[im 69/94  soft-tissue]
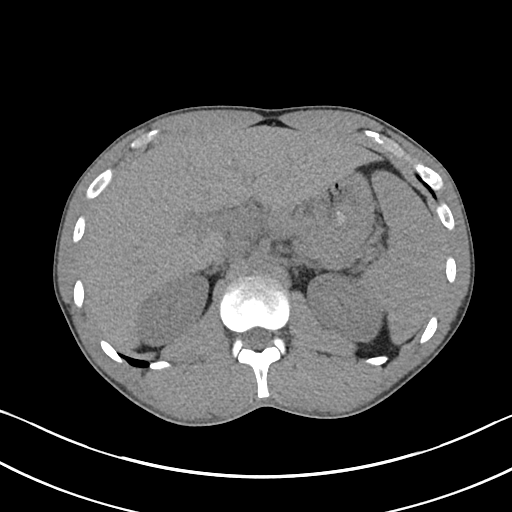
[im 75/94  soft-tissue]
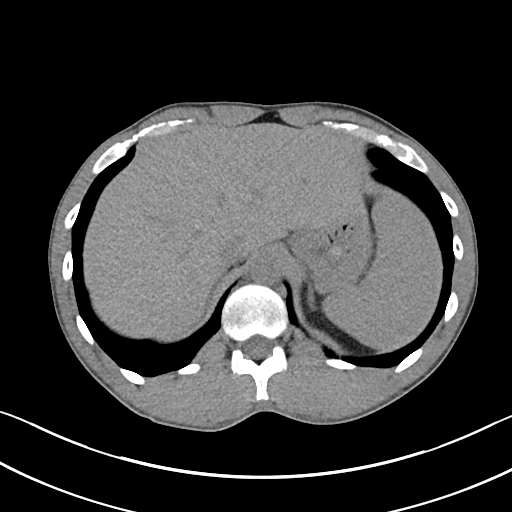
[im 81/94  soft-tissue]
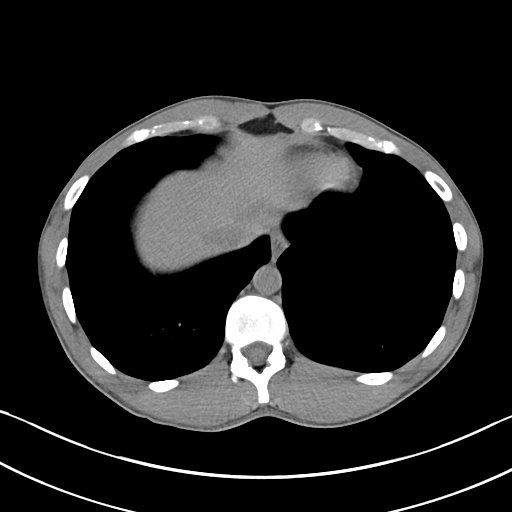
[im 87/94  soft-tissue]
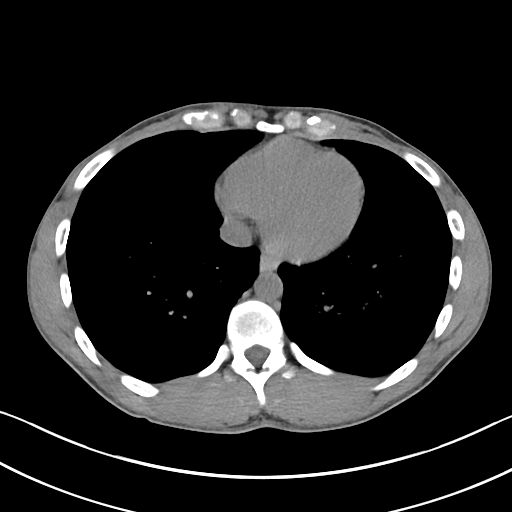

[Series 6: cor · coronal · 0.72mm/px · 3 of 81 slices shown]
[im 27/81  soft-tissue]
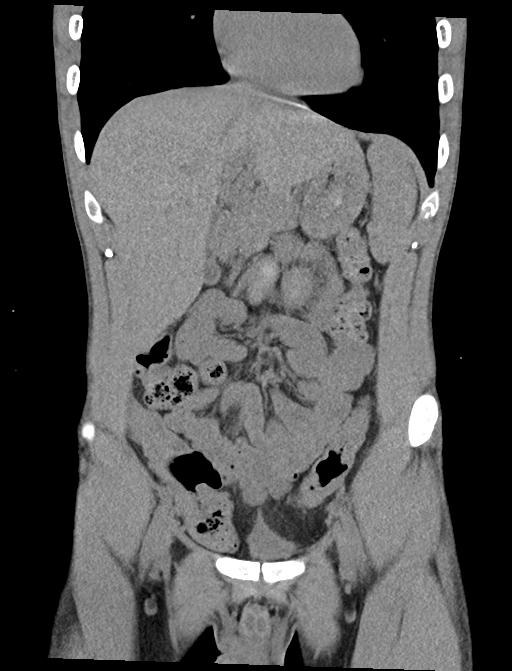
[im 36/81  soft-tissue]
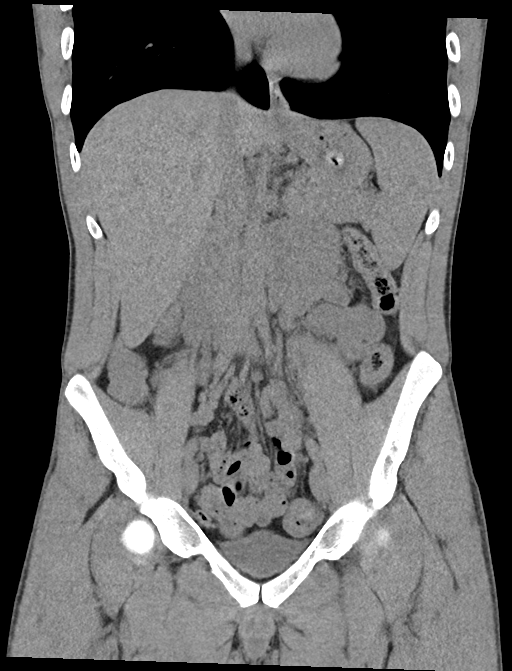
[im 45/81  soft-tissue]
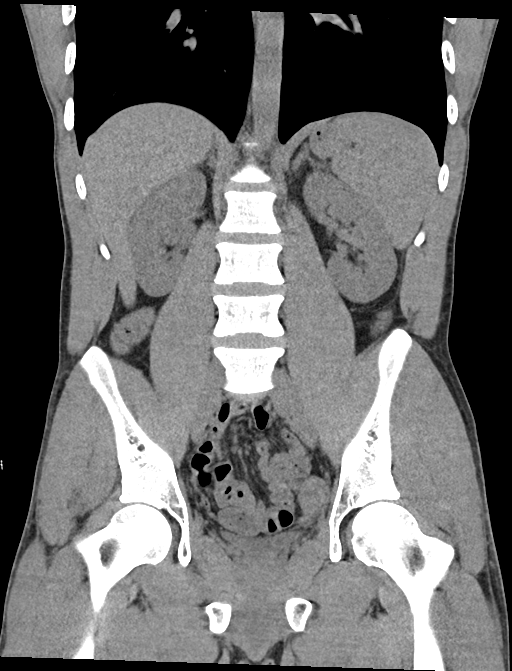

[16 of 46 positions shown; findings below may reference images not displayed]

FINDINGS: Lower chest: Lung bases clear.  No pleural or pericardial effusion.

Hepatobiliary: No focal liver abnormality is seen. No gallstones,
gallbladder wall thickening, or biliary dilatation.

Pancreas: Unremarkable. No pancreatic ductal dilatation or
surrounding inflammatory changes.

Spleen: Normal in size without focal abnormality.

Adrenals/Urinary Tract: Adrenal glands are unremarkable. Kidneys are
normal, without renal calculi, focal lesion, or hydronephrosis.
Bladder is unremarkable.

Stomach/Bowel: Stomach is within normal limits. Appendix appears
normal. No evidence of bowel wall thickening, distention, or
inflammatory changes.

Vascular/Lymphatic: No significant vascular findings are present. No
enlarged abdominal or pelvic lymph nodes.

Reproductive: Prostate is unremarkable.

Other: None.

Musculoskeletal: Negative.
IMPRESSION: Negative for urinary tract stone.  Negative CT abdomen and pelvis.
